# Patient Record
Sex: Male | Born: 1995 | Race: White | Hispanic: No | Marital: Single | Smoking: Never smoker
Health system: Southern US, Community
[De-identification: ages and names within clinical notes are randomized; demographics above are authoritative.]

## PROBLEM LIST (undated history)

## (undated) HISTORY — PX: WISDOM TOOTH EXTRACTION: SHX21

## (undated) HISTORY — PX: LEG SURGERY: SHX1003

---

## 2020-03-24 ENCOUNTER — Ambulatory Visit (INDEPENDENT_AMBULATORY_CARE_PROVIDER_SITE_OTHER): Payer: 59

## 2020-03-24 ENCOUNTER — Other Ambulatory Visit: Payer: Self-pay

## 2020-03-24 ENCOUNTER — Ambulatory Visit (HOSPITAL_COMMUNITY)
Admission: EM | Admit: 2020-03-24 | Discharge: 2020-03-24 | Disposition: A | Discharge status: Home / Self Care | Payer: 59 | Attending: Family Medicine | Admitting: Family Medicine

## 2020-03-24 ENCOUNTER — Encounter (HOSPITAL_COMMUNITY): Payer: Self-pay | Admitting: Emergency Medicine

## 2020-03-24 DIAGNOSIS — M25571 Pain in right ankle and joints of right foot: Secondary | ICD-10-CM

## 2020-03-24 DIAGNOSIS — S93491A Sprain of other ligament of right ankle, initial encounter: Secondary | ICD-10-CM

## 2020-03-24 NOTE — ED Notes (Signed)
Spoke to dr hagler about patient's response to blood pressure being taken

## 2020-03-24 NOTE — ED Triage Notes (Signed)
Playing basketball last night, landed on court, rolled right ankle and injured lower left back.  Right ankle with swelling , bruising, and pain

## 2020-03-25 NOTE — ED Provider Notes (Signed)
[facility]   [id number] 03/24/20 Arrival Time: 1400  ASSESSMENT & PLAN:  1. Acute right ankle pain   2. Sprain of anterior talofibular ligament of right ankle, initial encounter     I have personally viewed the imaging studies ordered this visit. No ankle fracture.  Orders Placed This Encounter  Procedures  . DG Ankle Complete Right  . Apply ASO lace-up ankle brace    Recommend:  Follow-up Information    [facility].   Why: If worsening or failing to improve as anticipated. Contact information: [address] [place] [address] [phone]              Natural history and expected course discussed. Questions answered. Rest, ice, compression, elevation (RICE) therapy. Fit with ankle brace for use over next 1 week.   Reviewed expectations re: course of current medical issues. Questions answered. Outlined signs and symptoms indicating need for more acute intervention. Patient verbalized understanding. After Visit Summary given.  SUBJECTIVE: History from: patient. William Barnett is a 24 y.o. male who reports fairly persistent mild to moderate pain of his right lateral ankle; described as aching; without radiation. Onset: abrupt. First noted: yesterday. Injury/trama: twisted while playing basketball. Also reports right lower back discomfort. No back trauma. Symptoms have progressed to a point and plateaued since beginning. Aggravating factors: certain movements and weight bearing. Alleviating factors: rest. Associated symptoms: none reported. Extremity sensation changes or weakness: none. Self treatment: none reported.   Past Surgical History:  Procedure Laterality Date  . LEG SURGERY Right   . WISDOM TOOTH EXTRACTION        OBJECTIVE:  Vitals:   03/24/20 1506  BP: (!) 89/53  Pulse: 65  Resp: 20  Temp: (!) 97 F (36.1 C)  TempSrc: Oral  SpO2: 100%    General appearance:  alert; no distress HEENT: ; AT Neck: supple with FROM Resp: unlabored respirations Extremities: . RLE: warm with well perfused appearance; fairly well localized moderate tenderness over right lateral ankle, spec over ATfL distribution; without gross deformities; swelling: moderate; bruising: minimal; ankle ROM: normal, with discomfort CV: brisk extremity capillary refill of RLE; 2+ DP pulse of RLE. Skin: warm and dry; no visible rashes Back: right lower muscular soreness Neurologic: gait normal; normal sensation and strength of bilateral LE Psychological: alert and cooperative; normal mood and affect  Imaging: DG Ankle Complete Right  Result Date: 03/24/2020 CLINICAL DATA:  Lateral right ankle pain following an inversion injury. EXAM: RIGHT ANKLE - COMPLETE 3+ VIEW COMPARISON:  None. FINDINGS: Mild anterior tibial and dorsal talonavicular spur formation. Possible small effusion. No fracture or dislocation seen. IMPRESSION: No fracture. Possible small effusion. Electronically Signed   By: Beckie Salts M.D.   On: 03/24/2020 16:15      Allergies  Allergen Reactions  . Amoxicillin Rash    History reviewed. No pertinent past medical history. Social History   Socioeconomic History  . Marital status: Single    Spouse name: Not on file  . Number of children: Not on file  . Years of education: Not on file  . Highest education level: Not on file  Occupational History  . Not on file  Tobacco Use  . Smoking status: Never Smoker  Substance and Sexual Activity  . Alcohol use: Yes  . Drug use: Never  . Sexual activity: Not on file  Other Topics Concern  . Not on file  Social History Narrative  . Not  on file   Social Determinants of Health   Financial Resource Strain:   . Difficulty of Paying Living Expenses: Not on file  Food Insecurity:   . Worried About Programme researcher, broadcasting/film/video in the Last Year: Not on file  . Ran Out of Food in the Last Year: Not on file  Transportation Needs:    . Lack of Transportation (Medical): Not on file  . Lack of Transportation (Non-Medical): Not on file  Physical Activity:   . Days of Exercise per Week: Not on file  . Minutes of Exercise per Session: Not on file  Stress:   . Feeling of Stress : Not on file  Social Connections:   . Frequency of Communication with Friends and Family: Not on file  . Frequency of Social Gatherings with Friends and Family: Not on file  . Attends Religious Services: Not on file  . Active Member of Clubs or Organizations: Not on file  . Attends Banker Meetings: Not on file  . Marital Status: Not on file   No family history on file. Past Surgical History:  Procedure Laterality Date  . LEG SURGERY Right   . WISDOM TOOTH EXTRACTION        Mardella Layman, MD 03/25/20 [phone]

## 2020-07-28 ENCOUNTER — Encounter (HOSPITAL_COMMUNITY): Payer: Self-pay | Admitting: Emergency Medicine

## 2020-07-28 ENCOUNTER — Ambulatory Visit (HOSPITAL_COMMUNITY)
Admission: EM | Admit: 2020-07-28 | Discharge: 2020-07-28 | Disposition: A | Discharge status: Home / Self Care | Payer: 59

## 2020-07-28 ENCOUNTER — Other Ambulatory Visit: Payer: Self-pay

## 2020-07-28 DIAGNOSIS — A084 Viral intestinal infection, unspecified: Secondary | ICD-10-CM

## 2020-07-28 MED ORDER — ONDANSETRON 4 MG PO TBDP
ORAL_TABLET | ORAL | Status: AC
  Filled 2020-07-28: qty 1

## 2020-07-28 MED ORDER — ONDANSETRON 4 MG PO TBDP
4.0000 mg | ORAL_TABLET | Freq: Once | ORAL | Status: AC
  Administered 2020-07-28: 4 mg via ORAL

## 2020-07-28 MED ORDER — ONDANSETRON 8 MG PO TBDP: 8.0000 mg | ORAL_TABLET | Freq: Three times a day (TID) | ORAL | 0 refills | Status: AC | PRN

## 2020-07-28 NOTE — Discharge Instructions (Addendum)
-  Start the Zofran as needed for nausea and vomiting.  You can dissolve 1 pill under your tongue up to every 8 hours for nausea and vomiting. -You can try Imodium over-the-counter if you need relief for diarrhea. -Make sure to drink plenty of fluids and eat a bland diet as tolerated. -Come back and see [place] if you develop abdominal pain in 1 place like your right lower quadrant, inability to hydrate by mouth despite treatment, chest pain, shortness of breath, dizziness.

## 2020-07-28 NOTE — ED Provider Notes (Signed)
[facility]    CSN: [id number] Arrival date & time: 07/28/20  1142      History   Chief Complaint Chief Complaint  Patient presents with  . Emesis  . Abdominal Cramping  . Nausea  . Diarrhea    HPI William Barnett IV is a 25 y.o. male presenting for viral gastroenteritis. States he requires a work note and a school note, otherwise he wouldn't stayed home and let illness run its course.  Notes 1 day of nausea, vomiting, diarrhea, generalized abdominal cramping.  About 10 episodes of green bilious vomit and same of watery diarrhea. Able to keep liquids but not food down. Denies hematemesis, BRBPR, melena. Denies fevers/chills, URI symptoms.  HPI  History reviewed. No pertinent past medical history.  There are no problems to display for this patient.   Past Surgical History:  Procedure Laterality Date  . LEG SURGERY Right   . WISDOM TOOTH EXTRACTION         Home Medications    Prior to Admission medications   Medication Sig Start Date End Date Taking? Authorizing Provider  gabapentin (NEURONTIN) 300 MG capsule 300 mg qHS 1 week, 300 mg BID 1 week, 300 mg TID from then on 06/24/20  Yes [provider]  ondansetron (ZOFRAN ODT) 8 MG disintegrating tablet Take 1 tablet (8 mg total) by mouth every 8 (eight) hours as needed for nausea or vomiting. 07/28/20  Yes Rhys Martini, PA-C    Family History History reviewed. No pertinent family history.  Social History Social History   Tobacco Use  . Smoking status: Never Smoker  . Smokeless tobacco: Never Used  Substance Use Topics  . Alcohol use: Yes  . Drug use: Never     Allergies   Amoxicillin   Review of Systems Review of Systems  Constitutional: Negative for appetite change, chills, diaphoresis, fever and unexpected weight change.  HENT: Negative for congestion, ear pain, sinus pressure, sinus pain, sneezing, sore throat and trouble swallowing.   Respiratory: Negative for cough,  chest tightness and shortness of breath.   Cardiovascular: Negative for chest pain.  Gastrointestinal: Positive for abdominal pain, diarrhea, nausea and vomiting. Negative for abdominal distention, anal bleeding, blood in stool, constipation and rectal pain.  Genitourinary: Negative for dysuria, flank pain, frequency and urgency.  Musculoskeletal: Negative for back pain and myalgias.  Neurological: Negative for dizziness, light-headedness and headaches.  All other systems reviewed and are negative.    Physical Exam Triage Vital Signs ED Triage Vitals  Enc Vitals Group     BP 07/28/20 1242 104/62     Pulse Rate 07/28/20 1242 77     Resp 07/28/20 1242 19     Temp 07/28/20 1242 98.6 F (37 C)     Temp Source 07/28/20 1242 Oral     SpO2 07/28/20 1242 96 %     Weight --      Height --      Head Circumference --      Peak Flow --      Pain Score 07/28/20 1240 0     Pain Loc --      Pain Edu? --      Excl. in GC? --    No data found.  Updated Vital Signs BP 104/62 (BP Location: Right Arm)   Pulse 77   Temp 98.6 F (37 C) (Oral)   Resp 19   SpO2 96%   Visual Acuity Right Eye Distance:   Left Eye Distance:  Bilateral Distance:    Right Eye Near:   Left Eye Near:    Bilateral Near:     Physical Exam Vitals reviewed.  Constitutional:      General: He is not in acute distress.    Appearance: Normal appearance. He is not ill-appearing.  HENT:     Head: Normocephalic and atraumatic.     Mouth/Throat:     Mouth: Mucous membranes are moist.     Comments: Moist mucous membranes Eyes:     Extraocular Movements: Extraocular movements intact.     Pupils: Pupils are equal, round, and reactive to light.  Cardiovascular:     Rate and Rhythm: Normal rate and regular rhythm.     Heart sounds: Normal heart sounds.  Pulmonary:     Effort: Pulmonary effort is normal.     Breath sounds: Normal breath sounds. No wheezing, rhonchi or rales.  Abdominal:     General: Bowel sounds  are normal. There is no distension.     Palpations: Abdomen is soft. There is no mass.     Tenderness: There is no abdominal tenderness. There is no right CVA tenderness, left CVA tenderness, guarding or rebound.  Skin:    General: Skin is warm.     Capillary Refill: Capillary refill takes less than 2 seconds.     Comments: Good skin turgor  Neurological:     General: No focal deficit present.     Mental Status: He is alert and oriented to person, place, and time.  Psychiatric:        Mood and Affect: Mood normal.        Behavior: Behavior normal.      UC Treatments / Results  Labs (all labs ordered are listed, but only abnormal results are displayed) Labs Reviewed - No data to display  EKG   Radiology No results found.  Procedures Procedures (including critical care time)  Medications Ordered in UC Medications  ondansetron (ZOFRAN-ODT) disintegrating tablet 4 mg (4 mg Oral Given 07/28/20 1246)    Initial Impression / Assessment and Plan / UC Course  I have reviewed the triage vital signs and the nursing notes.  Pertinent labs & imaging results that were available during my care of the patient were reviewed by me and considered in my medical decision making (see chart for details).     This patient is a 25 year old male presenting with viral gastroenteritis. Today this pt is afebrile nontachycardic nontachypneic, oxygenating well on room air, no wheezes rhonchi or rales. Appears well hydrated.  Work and school note provided.   Zofran ODT sent; patient states he doesn't plan on picking this up. Imodium OTC as needed. Rec good hydration, BRAT diet.  Return precautions discussed.  This chart was dictated using voice recognition software, Dragon. Despite the best efforts of this provider to proofread and correct errors, errors may still occur which can change documentation meaning.   Final Clinical Impressions(s) / UC Diagnoses   Final diagnoses:  Viral  gastroenteritis     Discharge Instructions     -Start the Zofran as needed for nausea and vomiting.  You can dissolve 1 pill under your tongue up to every 8 hours for nausea and vomiting. -You can try Imodium over-the-counter if you need relief for diarrhea. -Make sure to drink plenty of fluids and eat a bland diet as tolerated. -Come back and see [place] if you develop abdominal pain in 1 place like your right lower quadrant, inability to hydrate by mouth  despite treatment, chest pain, shortness of breath, dizziness.    ED Prescriptions    Medication Sig Dispense Auth. Provider   ondansetron (ZOFRAN ODT) 8 MG disintegrating tablet Take 1 tablet (8 mg total) by mouth every 8 (eight) hours as needed for nausea or vomiting. 20 tablet Rhys Martini, PA-C     PDMP not reviewed this encounter.   Rhys Martini, PA-C 07/28/20 1408

## 2020-07-28 NOTE — ED Triage Notes (Signed)
Pt present with N,V,D, and abdominal cramping that started yesterday afternoon. States unable to keep any food and minimal liquids down.

## 2022-11-12 IMAGING — DX DG ANKLE COMPLETE 3+V*R*
3 series · 3 of 3 positions shown · non-contrast
Comparison: None.

CLINICAL DATA: Lateral right ankle pain following an inversion
injury.

EXAM:
RIGHT ANKLE - COMPLETE 3+ VIEW

[ankle ap]
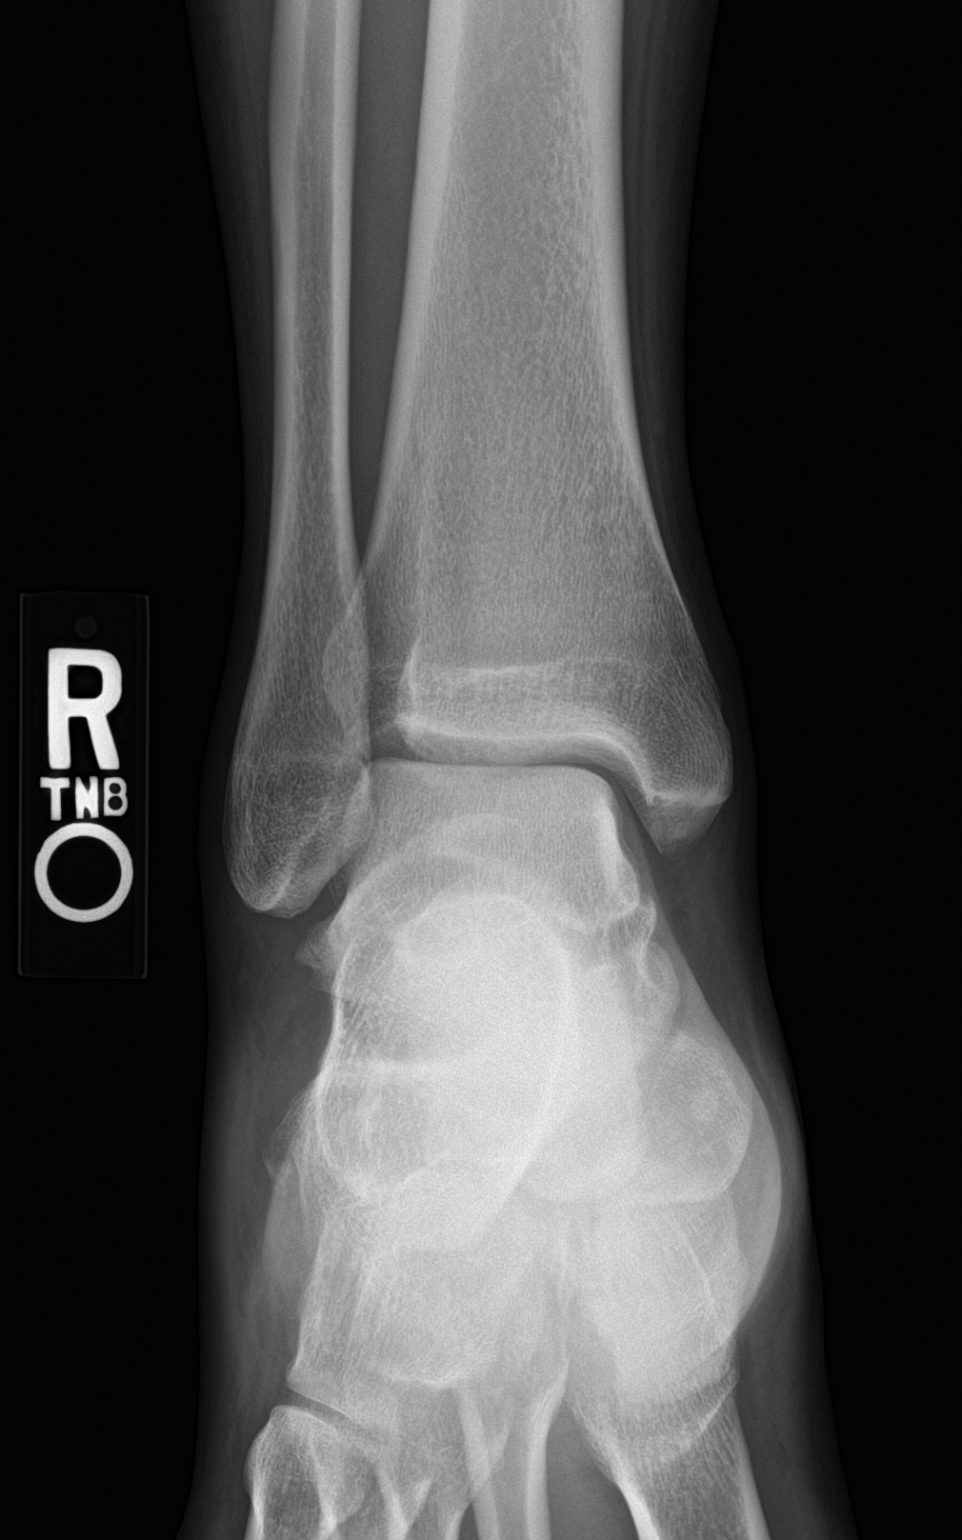

[ankle obl]
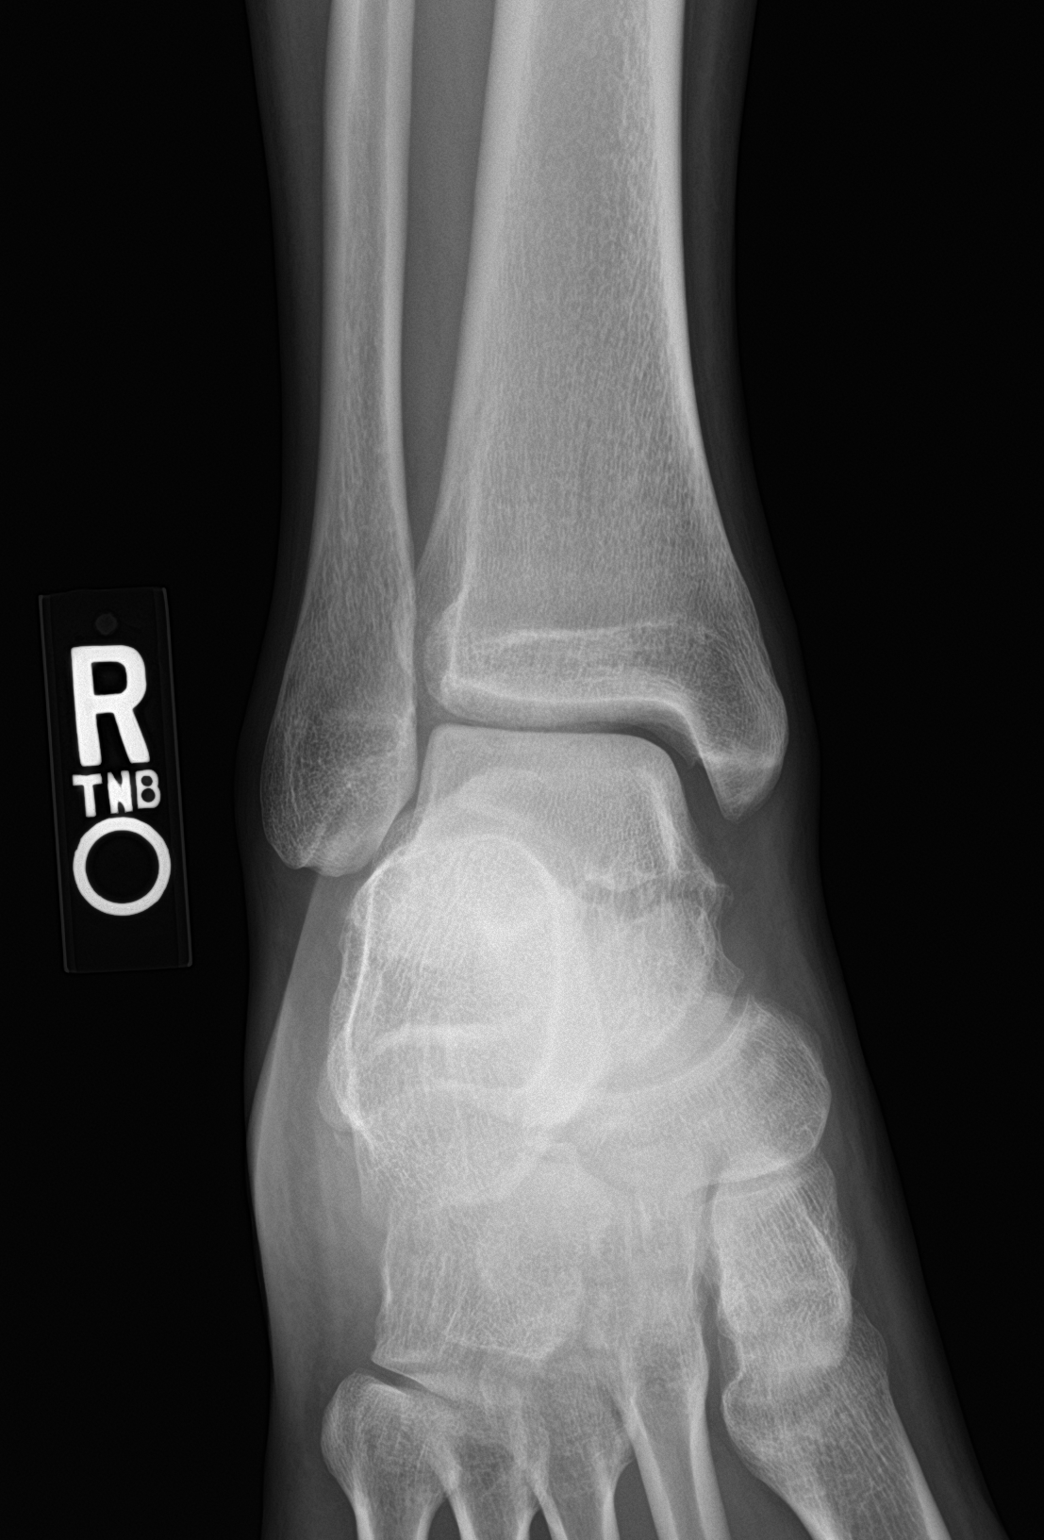

[ankle lat]
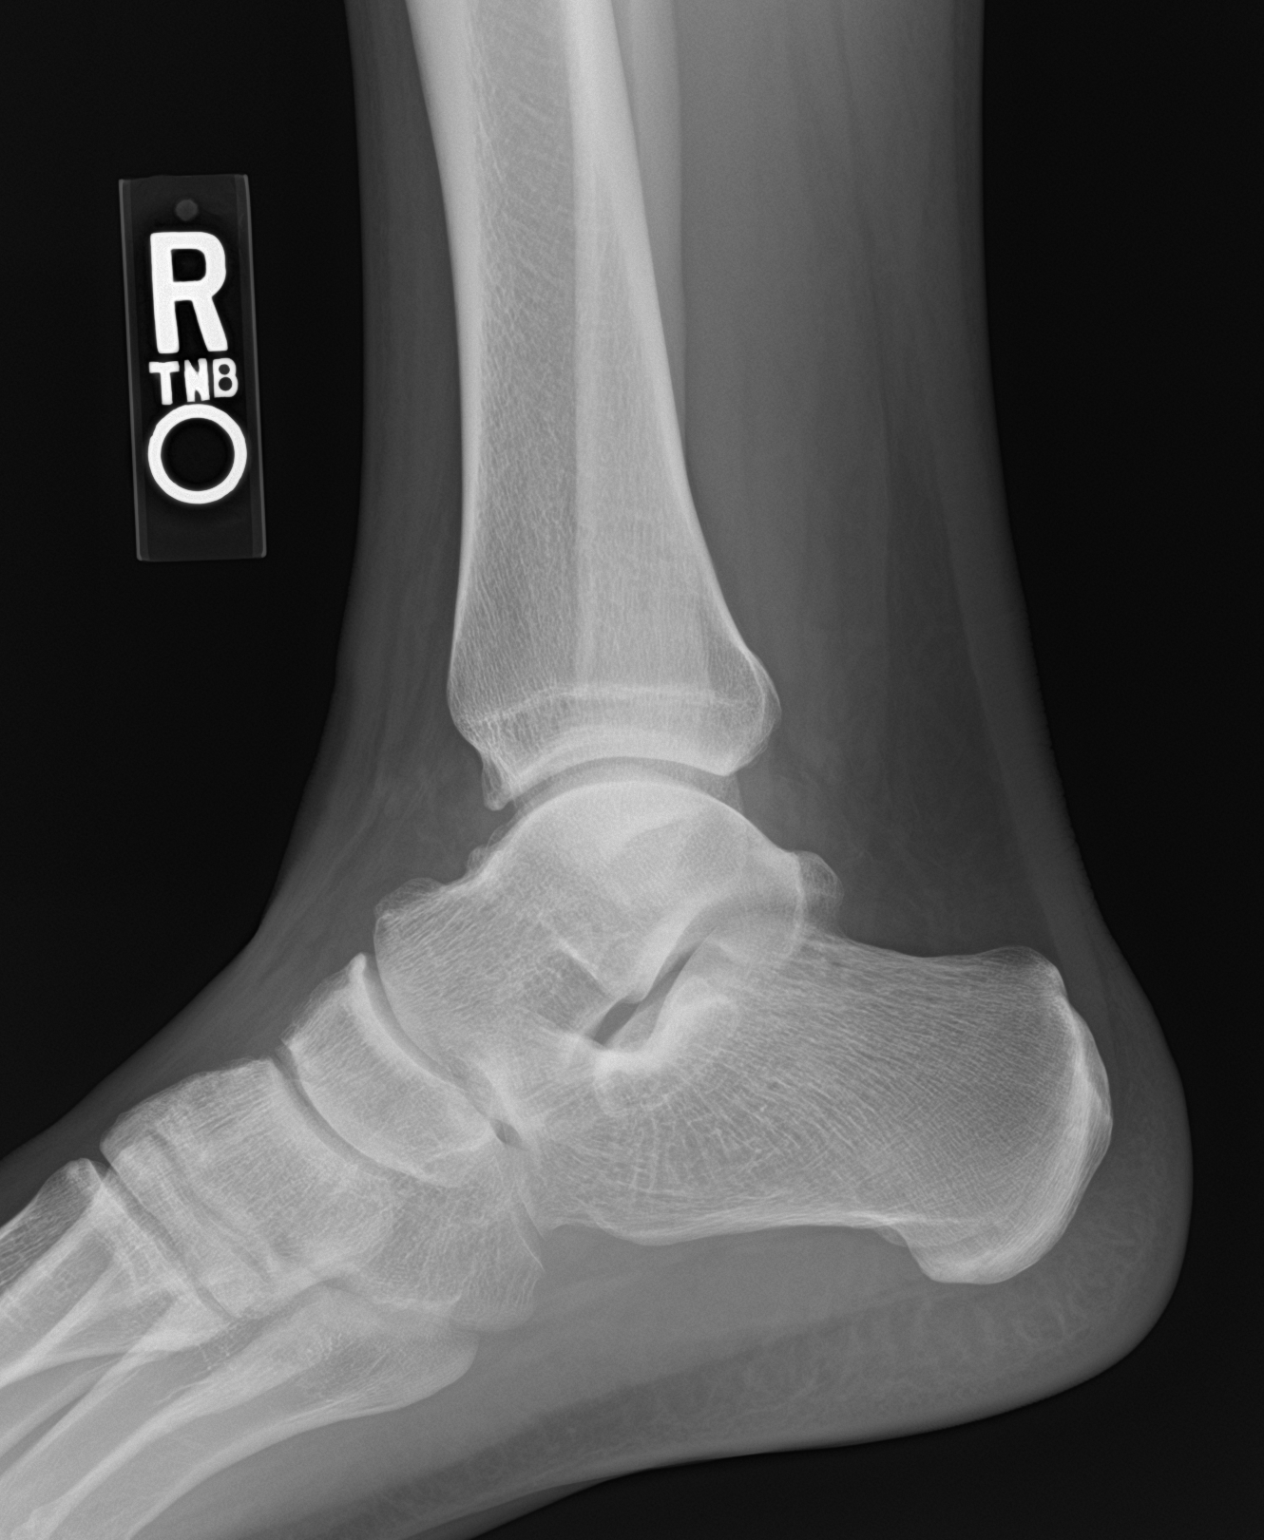

[3 of 3 positions shown; findings below may reference images not displayed]

FINDINGS: Mild anterior tibial and dorsal talonavicular spur formation.
Possible small effusion. No fracture or dislocation seen.
IMPRESSION: No fracture. Possible small effusion.
